# Patient Record
Sex: Male | Born: 1985 | Race: Black or African American | Hispanic: No | Marital: Single | Smoking: Current every day smoker
Health system: Southern US, Community
[De-identification: ages and names within clinical notes are randomized; demographics above are authoritative.]

## PROBLEM LIST (undated history)

## (undated) ENCOUNTER — Emergency Department (HOSPITAL_COMMUNITY): Admission: EM | Disposition: A | Discharge status: Home / Self Care | Payer: Self-pay

## (undated) DIAGNOSIS — Z9109 Other allergy status, other than to drugs and biological substances: Secondary | ICD-10-CM

## (undated) DIAGNOSIS — S86019A Strain of unspecified Achilles tendon, initial encounter: Secondary | ICD-10-CM

---
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
## 2008-08-13 ENCOUNTER — Emergency Department (HOSPITAL_COMMUNITY): Admission: EM | Admit: 2008-08-13 | Discharge: 2008-08-13 | Payer: Self-pay | Admitting: Emergency Medicine

## 2009-04-30 ENCOUNTER — Emergency Department (HOSPITAL_COMMUNITY): Admission: EM | Admit: 2009-04-30 | Discharge: 2009-04-30 | Payer: Self-pay | Admitting: Emergency Medicine

## 2009-05-08 ENCOUNTER — Emergency Department (HOSPITAL_COMMUNITY): Admission: EM | Admit: 2009-05-08 | Discharge: 2009-05-08 | Payer: Self-pay | Admitting: Emergency Medicine

## 2009-07-28 ENCOUNTER — Emergency Department (HOSPITAL_COMMUNITY): Admission: EM | Admit: 2009-07-28 | Discharge: 2009-07-28 | Payer: Self-pay | Admitting: Emergency Medicine

## 2009-08-07 ENCOUNTER — Emergency Department (HOSPITAL_COMMUNITY): Admission: EM | Admit: 2009-08-07 | Discharge: 2009-08-07 | Payer: Self-pay | Admitting: Emergency Medicine

## 2010-03-25 LAB — BASIC METABOLIC PANEL
BUN: 13 mg/dL (ref 6–23)
CO2: 25 mEq/L (ref 19–32)
Calcium: 8.8 mg/dL (ref 8.4–10.5)
Chloride: 103 mEq/L (ref 96–112)
Creatinine, Ser: 1.26 mg/dL (ref 0.4–1.5)
GFR calc Af Amer: >60 mL/min (ref >60)
GFR calc non Af Amer: >60 mL/min (ref >60)
Glucose, Bld: 88 mg/dL (ref 70–99)
Potassium: 3 mEq/L — ABNORMAL LOW (ref 3.5–5.1)
Sodium: 139 mEq/L (ref 135–145)

## 2010-03-25 LAB — RAPID URINE DRUG SCREEN, HOSP PERFORMED
Amphetamines: NOT DETECTED
Barbiturates: NOT DETECTED
Benzodiazepines: NOT DETECTED
Opiates: NOT DETECTED

## 2010-03-25 LAB — CBC
MCHC: 33.1 g/dL (ref 30.0–36.0)
RBC: 4.58 MIL/uL (ref 4.22–5.81)
RDW: 13.7 % (ref 11.5–15.5)

## 2010-03-25 LAB — URINALYSIS, ROUTINE W REFLEX MICROSCOPIC
Glucose, UA: NEGATIVE mg/dL
Hgb urine dipstick: NEGATIVE
Specific Gravity, Urine: 1.041 — ABNORMAL HIGH (ref 1.005–1.030)
pH: 6 (ref 5.0–8.0)

## 2010-03-25 LAB — DIFFERENTIAL
Basophils Absolute: 0 10*3/uL (ref 0.0–0.1)
Basophils Relative: 0 % (ref 0–1)
Monocytes Relative: 10 % (ref 3–12)
Neutro Abs: 5.1 10*3/uL (ref 1.7–7.7)
Neutrophils Relative %: 63 % (ref 43–77)

## 2010-03-25 LAB — PROTIME-INR
INR: 0.9 (ref 0.00–1.49)
Prothrombin Time: 12.1 seconds (ref 11.6–15.2)

## 2010-03-25 LAB — APTT: aPTT: 25 seconds (ref 24–37)

## 2010-03-25 LAB — ETHANOL: Alcohol, Ethyl (B): 126 mg/dL — ABNORMAL HIGH (ref 0–10)

## 2010-04-13 LAB — URINALYSIS, ROUTINE W REFLEX MICROSCOPIC
Bilirubin Urine: NEGATIVE
Glucose, UA: NEGATIVE mg/dL
Hgb urine dipstick: NEGATIVE
Protein, ur: NEGATIVE mg/dL
Urobilinogen, UA: 1 mg/dL (ref 0.0–1.0)

## 2010-04-13 LAB — GC/CHLAMYDIA PROBE AMP, GENITAL
Chlamydia, DNA Probe: NEGATIVE
GC Probe Amp, Genital: NEGATIVE

## 2010-04-13 LAB — URINE MICROSCOPIC-ADD ON

## 2011-02-06 ENCOUNTER — Encounter (HOSPITAL_COMMUNITY): Payer: Self-pay

## 2011-02-06 ENCOUNTER — Emergency Department (INDEPENDENT_AMBULATORY_CARE_PROVIDER_SITE_OTHER)
Admission: EM | Admit: 2011-02-06 | Discharge: 2011-02-06 | Disposition: A | Discharge status: Home / Self Care | Payer: Self-pay | Source: Home / Self Care | Attending: Emergency Medicine | Admitting: Emergency Medicine

## 2011-02-06 DIAGNOSIS — B350 Tinea barbae and tinea capitis: Secondary | ICD-10-CM

## 2011-02-06 DIAGNOSIS — L918 Other hypertrophic disorders of the skin: Secondary | ICD-10-CM

## 2011-02-06 DIAGNOSIS — L909 Atrophic disorder of skin, unspecified: Secondary | ICD-10-CM

## 2011-02-06 MED ORDER — KETOCONAZOLE 2 % EX CREA: TOPICAL_CREAM | Freq: Two times a day (BID) | CUTANEOUS | Status: DC

## 2011-02-06 MED ORDER — KETOCONAZOLE 2 % EX SHAM: MEDICATED_SHAMPOO | CUTANEOUS | Status: AC

## 2011-02-06 NOTE — ED Provider Notes (Addendum)
History     CSN: [id number]  Arrival date & time 02/06/11  1239   First MD Initiated Contact with Patient 02/06/11 1312      Chief Complaint  Patient presents with  . Tinea  . Rash    (Consider location/radiation/quality/duration/timing/severity/associated sxs/prior treatment) Patient is a 26 y.o. male presenting with rash. The history is provided by the patient.  Rash  This is a new problem. Episode onset:  more than 2 weeks scalp ringworm and months with  skin tags almost 1-2 yerars. The problem is associated with nothing. There has been no fever. The patient is experiencing no pain. He has tried nothing for the symptoms. The treatment provided no relief.    History reviewed. No pertinent past medical history.  History reviewed. No pertinent past surgical history.  No family history on file.  History  Substance Use Topics  . Smoking status: Never Smoker   . Smokeless tobacco: Not on file  . Alcohol Use: No      Review of Systems  Constitutional: Negative for fever and fatigue.  Skin: Positive for rash.    Allergies  Review of patient's allergies indicates no known allergies.  Home Medications   Current Outpatient Rx  Name Route Sig Dispense Refill  . KETOCONAZOLE 2 % EX CREA Topical Apply topically 2 (two) times daily. X 3 weeks 75 g 0  . KETOCONAZOLE 2 % EX SHAM Topical Apply topically 2 (two) times a week. X 2 weeks 120 mL 0    BP 128/73  Pulse 65  Temp(Src) 98.7 F (37.1 C) (Oral)  Resp 20  SpO2 98%  Physical Exam  Nursing note and vitals reviewed. Constitutional: He appears well-developed and well-nourished. No distress.  HENT:  Head: Normocephalic.    Eyes: Conjunctivae are normal.  Neck: Neck supple.  Genitourinary:     Skin: Rash noted. No erythema.    ED Course  Procedures (including critical care time)  Labs Reviewed - No data to display No results found.   1. Tinea capitis   2. Cutaneous skin tags       MDM  Tinea  capitus and suprapubic condylomas acuminata- referred to dermatologist. Does not want to take tablets concerned about liver cause drinking other supplements. Wants to try with local shampoos despite been advised probably will not be efficient        Jimmie Molly, MD 02/06/11 1402  Jimmie Molly, MD 02/06/11 [phone]

## 2011-02-06 NOTE — ED Notes (Signed)
C/o ringworm to post. Scalp that he noticed approx 1 week ago.  Also states he has some "moles" in his groin area that are getting larger over the last year that he would like checked.

## 2011-06-11 ENCOUNTER — Encounter (HOSPITAL_COMMUNITY): Payer: Self-pay

## 2011-06-11 ENCOUNTER — Emergency Department (INDEPENDENT_AMBULATORY_CARE_PROVIDER_SITE_OTHER)
Admission: EM | Admit: 2011-06-11 | Discharge: 2011-06-11 | Disposition: A | Discharge status: Home / Self Care | Payer: Self-pay | Source: Home / Self Care | Attending: Family Medicine | Admitting: Family Medicine

## 2011-06-11 DIAGNOSIS — B029 Zoster without complications: Secondary | ICD-10-CM

## 2011-06-11 MED ORDER — ACYCLOVIR 800 MG PO TABS: ORAL_TABLET | ORAL | Status: DC

## 2011-06-11 MED ORDER — ACYCLOVIR 800 MG PO TABS: 800.0000 mg | ORAL_TABLET | Freq: Every day | ORAL | Status: DC

## 2011-06-11 NOTE — Discharge Instructions (Signed)
Avoid pregnant females. Follow up as needed. May take ibuprofen as needed for discomfort. Shingles Shingles is caused by the same virus that causes chickenpox (varicella zoster virus or VZV). Shingles often occurs many years or decades after having chickenpox. That is why it is more common in adults older than 50 years. The virus reactivates and breaks out as an infection in a nerve root. SYMPTOMS   The initial feeling (sensations) may be pain. This pain is usually described as:   Burning.   Stabbing.   Throbbing.   Tingling in the nerve root.   A red rash will follow in a couple days. The rash may occur in any area of the body and is usually on one side (unilateral) of the body in a band or belt-like pattern. The rash usually starts out as very small blisters (vesicles). They will dry up after 7 to 10 days. This is not usually a significant problem except for the pain it causes.   Long-lasting (chronic) pain is more likely in an elderly person. It can last months to years. This condition is called postherpetic neuralgia.  Shingles can be an extremely severe infection in someone with AIDS, a weakened immune system, or with forms of leukemia. It can also be severe if you are taking transplant medicines or other medicines that weaken the immune system. TREATMENT  Your caregiver will often treat you with:  Antiviral drugs.   Anti-inflammatory drugs.   Pain medicines.  Bed rest is very important in preventing the pain associated with herpes zoster (postherpetic neuralgia). Application of heat in the form of a hot water bottle or electric heating pad or gentle pressure with the hand is recommended to help with the pain or discomfort. PREVENTION  A varicella zoster vaccine is available to help protect against the virus. The Food and Drug Administration approved the varicella zoster vaccine for individuals 48 years of age and older. HOME CARE INSTRUCTIONS   Cool compresses to the area of rash  may be helpful.   Only take over-the-counter or prescription medicines for pain, discomfort, or fever as directed by your caregiver.   Avoid contact with:   Babies.   Pregnant women.   Children with eczema.   Elderly people with transplants.   People with chronic illnesses, such as leukemia and AIDS.   If the area involved is on your face, you may receive a referral for follow-up to a specialist. It is very important to keep all follow-up appointments. This will help avoid eye complications, chronic pain, or disability.  SEEK IMMEDIATE MEDICAL CARE IF:   You develop any pain (headache) in the area of the face or eye. This must be followed carefully by your caregiver or ophthalmologist. An infection in part of your eye (cornea) can be very serious. It could lead to blindness.   You do not have pain relief from prescribed medicines.   Your redness or swelling spreads.   The area involved becomes very swollen and painful.   You have a fever.   You notice any red or painful lines extending away from the affected area toward your heart (lymphangitis).   Your condition is worsening or has changed.  Document Released: 12/22/2004 Document Revised: 12/11/2010 Document Reviewed: 11/26/2008 [facility] Patient Information 2012 [place], [place].

## 2011-06-11 NOTE — ED Provider Notes (Signed)
History     CSN: [id number]  Arrival date & time 06/11/11  1109   First MD Initiated Contact with Patient 06/11/11 1131      Chief Complaint  Patient presents with  . Skin Ulcer    (Consider location/radiation/quality/duration/timing/severity/associated sxs/prior treatment) HPI Comments: The patient noted a tingling left upper chest 2 days prior to the onset of a blistering erruption. Lesions now painful and he has a swollen lymph node in his axilla. No treatment pta.   The history is provided by the patient.    History reviewed. No pertinent past medical history.  History reviewed. No pertinent past surgical history.  History reviewed. No pertinent family history.  History  Substance Use Topics  . Smoking status: Never Smoker   . Smokeless tobacco: Not on file  . Alcohol Use: No      Review of Systems  Allergies  Review of patient's allergies indicates no known allergies.  Home Medications   Current Outpatient Rx  Name Route Sig Dispense Refill  . KETOCONAZOLE 2 % EX CREA Topical Apply topically 2 (two) times daily. X 3 weeks 75 g 0    BP 139/76  Pulse 61  Temp(Src) 98.4 F (36.9 C) (Oral)  Resp 20  SpO2 100%  Physical Exam  Nursing note and vitals reviewed. Constitutional: He appears well-developed and well-nourished. No distress.  HENT:  Head: Normocephalic and atraumatic.  Cardiovascular: Normal rate and regular rhythm.   Pulmonary/Chest: Effort normal and breath sounds normal.  Skin: Skin is warm.       Blistering eruption note left upper ant chest. Appearance of shingles.     ED Course  Procedures (including critical care time)  Labs Reviewed - No data to display No results found.   1. Shingles       MDM          Randa Spike, MD 06/11/11 1324

## 2011-06-11 NOTE — ED Notes (Signed)
Noted lesion left chest 2-3 days ago, and has a lump in left axilla since then; NAD

## 2011-06-13 ENCOUNTER — Emergency Department (HOSPITAL_COMMUNITY)
Admission: EM | Admit: 2011-06-13 | Discharge: 2011-06-13 | Disposition: A | Discharge status: Home / Self Care | Payer: Self-pay | Attending: Emergency Medicine | Admitting: Emergency Medicine

## 2011-06-13 ENCOUNTER — Encounter (HOSPITAL_COMMUNITY): Payer: Self-pay

## 2011-06-13 DIAGNOSIS — B029 Zoster without complications: Secondary | ICD-10-CM | POA: Insufficient documentation

## 2011-06-13 MED ORDER — OXYCODONE-ACETAMINOPHEN 5-325 MG PO TABS: 1.0000 | ORAL_TABLET | Freq: Four times a day (QID) | ORAL | Status: AC | PRN

## 2011-06-13 MED ORDER — PREDNISONE 20 MG PO TABS: 40.0000 mg | ORAL_TABLET | Freq: Every day | ORAL | Status: DC

## 2011-06-13 NOTE — ED Provider Notes (Signed)
History     CSN: [id number]  Arrival date & time 06/13/11  1209   First MD Initiated Contact with Patient 06/13/11 1242      Chief Complaint  Patient presents with  . Rash    (Consider location/radiation/quality/duration/timing/severity/associated sxs/prior treatment) Patient is a 26 y.o. male presenting with rash. The history is provided by the patient.  Rash  This is a new problem. The current episode started more than 2 days ago. The problem has not changed since onset.The problem is associated with nothing. There has been no fever. The rash is present on the torso. The pain is moderate. The pain has been fluctuating since onset. Associated symptoms include blisters, itching and pain. Treatments tried: acyclovir. Improvement on treatment: yes, with taking his mother's oxycodone.  Seen at [facility] 2 days ago with antiviral initiation. Continued rash. No fever, chills. No purulent drainage.  Past Medical History  Diagnosis Date  . Shingles outbreak     History reviewed. No pertinent past surgical history.  History reviewed. No pertinent family history.  History  Substance Use Topics  . Smoking status: Never Smoker   . Smokeless tobacco: Not on file  . Alcohol Use: No      Review of Systems  Constitutional: Negative for fever and chills.  Musculoskeletal: Negative for back pain.  Skin: Positive for itching and rash.  Neurological: Negative for headaches.    Allergies  Review of patient's allergies indicates no known allergies.  Home Medications   Current Outpatient Rx  Name Route Sig Dispense Refill  . ACYCLOVIR 800 MG PO TABS Oral Take 800 mg by mouth See admin instructions. Take 1 tab five times daily      BP 134/73  Pulse 86  Temp(Src) 98.7 F (37.1 C) (Oral)  Resp 22  Ht 5\' 9"  (1.753 m)  Wt 205 lb (92.987 kg)  BMI 30.27 kg/m2  SpO2 99%  Physical Exam  Nursing note and vitals reviewed. Constitutional: He appears well-developed and well-nourished. No  distress.  HENT:  Head: Normocephalic and atraumatic.  Neck: Neck supple.  Cardiovascular: Normal rate.   Pulmonary/Chest: Effort normal.  Neurological: He is alert.  Skin: Skin is warm and dry.       Vesicular rash with both blisters and crusted lesions to left lateral T3 dermatome. No active drainage seen    ED Course  Procedures (including critical care time)  Labs Reviewed - No data to display No results found.   Dx 1: Shingles   MDM  Shingles rash. Will add prednisone and pain rx per discussion with pt. No signs of infection to blistered area.         Shaaron Adler, PA-C 06/13/11 1329

## 2011-06-13 NOTE — ED Notes (Signed)
[id number]<AV> Expected date:<BR> Expected time:<BR> Means of arrival:<BR> Comments:<BR> Hold for Triage 2

## 2011-06-13 NOTE — ED Notes (Signed)
Airborne precautions and contact precautions posted on door. Pt aware.

## 2011-06-13 NOTE — Discharge Instructions (Signed)
Shingles Shingles is caused by the same virus that causes chickenpox (varicella zoster virus or VZV). Shingles often occurs many years or decades after having chickenpox. That is why it is more common in adults older than 50 years. The virus reactivates and breaks out as an infection in a nerve root. SYMPTOMS   The initial feeling (sensations) may be pain. This pain is usually described as:   Burning.   Stabbing.   Throbbing.   Tingling in the nerve root.   A red rash will follow in a couple days. The rash may occur in any area of the body and is usually on one side (unilateral) of the body in a band or belt-like pattern. The rash usually starts out as very small blisters (vesicles). They will dry up after 7 to 10 days. This is not usually a significant problem except for the pain it causes.   Long-lasting (chronic) pain is more likely in an elderly person. It can last months to years. This condition is called postherpetic neuralgia.  Shingles can be an extremely severe infection in someone with AIDS, a weakened immune system, or with forms of leukemia. It can also be severe if you are taking transplant medicines or other medicines that weaken the immune system. TREATMENT  Your caregiver will often treat you with:  Antiviral drugs.   Anti-inflammatory drugs.   Pain medicines.  Bed rest is very important in preventing the pain associated with herpes zoster (postherpetic neuralgia). Application of heat in the form of a hot water bottle or electric heating pad or gentle pressure with the hand is recommended to help with the pain or discomfort. PREVENTION  A varicella zoster vaccine is available to help protect against the virus. The Food and Drug Administration approved the varicella zoster vaccine for individuals 50 years of age and older. HOME CARE INSTRUCTIONS   Cool compresses to the area of rash may be helpful.   Only take over-the-counter or prescription medicines for pain,  discomfort, or fever as directed by your caregiver.   Avoid contact with:   Babies.   Pregnant women.   Children with eczema.   Elderly people with transplants.   People with chronic illnesses, such as leukemia and AIDS.   If the area involved is on your face, you may receive a referral for follow-up to a specialist. It is very important to keep all follow-up appointments. This will help avoid eye complications, chronic pain, or disability.  SEEK IMMEDIATE MEDICAL CARE IF:   You develop any pain (headache) in the area of the face or eye. This must be followed carefully by your caregiver or ophthalmologist. An infection in part of your eye (cornea) can be very serious. It could lead to blindness.   You do not have pain relief from prescribed medicines.   Your redness or swelling spreads.   The area involved becomes very swollen and painful.   You have a fever.   You notice any red or painful lines extending away from the affected area toward your heart (lymphangitis).   Your condition is worsening or has changed.  Document Released: 12/22/2004 Document Revised: 12/11/2010 Document Reviewed: 11/26/2008 [facility] Patient Information 2012 [place], [place].     If you have no primary doctor, here are some resources that may be helpful:  Medicaid-accepting [facility] Providers:   - [facility]- [address]      [phone]      Mon-Fri 9am-7pm, Sat 9am-1pm   -  [facility]- [address] [place], [address] [place]      [phone]   - [facility]- [address], [address] [place]      [phone]   [facility]- [address]      [phone]   - Renaye Rakers- [address] [place], [address]      [phone]      Only accepts [place] patients       after they have her name applied to their card   Self Pay (no insurance) in [place]:   - Sickle Cell Patients: Dr Willey Blade, [facility]  Internal Medicine      [address] [place]      [phone]   - Health [phone]   - Physician Referral Service- [phone]   - [facility]- [address] [place]      [phone]   [facility]- [address]   - [facility]- see information above      (Speak to [employer] if you do not have insurance)   - [facility]- [address] [place]      [phone]   - [facility] [place]- [address]      [phone]   - [facility]- [address]      [phone]   - Dr Julio Sicks-  [address], [place]      [phone]   - [facility]- [address]      [phone]   - [facility]- [address]      [phone]      Also [address]      [phone]   - [facility]- [address]      [phone]      1st and 3rd Saturday every month, 10am-1pm Other agencies that provide inexpensive medical care:    [facility]  [phone]    [facility] Internal Medicine  [phone]    [facility]  [phone]    [facility]  [phone]    [facility]  [phone]  General Information: Finding a doctor when you do not have health insurance can be tricky. Although you are not limited by an insurance plan, you are of course limited by her finances and how much but he can pay out of pocket.  What are your options if you don't have health insurance?   1) Find a Librarian, academic and Pay Out of Pocket Although you won't have to find out who is covered by your insurance plan, it is a good idea to ask around and get recommendations. You will then need to call the office and see if the doctor you have chosen will accept you as a new patient and what types of options they offer for patients who are self-pay. Some doctors offer discounts or will set up payment plans for their patients who do not have insurance, but you will need to ask so you aren't  surprised when you get to your appointment.  2) Contact Your Local Health Department Not all health departments have doctors that can see patients for sick visits, but many do, so it is worth a call to see if yours does. If you don't know where your local health department is, you can check in your phone  book. The CDC also has a tool to help you locate your state's health department, and many state websites also have listings of all of their local health departments.  3) Find a Walk-in Clinic If your illness is not likely to be very severe or complicated, you may want to try a walk in clinic. These are popping up all over the country in pharmacies, drugstores, and shopping centers. They're usually staffed by nurse practitioners or physician assistants that have been trained to treat common illnesses and complaints. They're usually fairly quick and inexpensive. However, if you have serious medical issues or chronic medical problems, these are probably not your best option
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
## 2011-06-13 NOTE — ED Provider Notes (Signed)
Medical screening examination/treatment/procedure(s) were performed by non-physician practitioner and as supervising physician I was immediately available for consultation/collaboration.  Gerhard Munch, MD 06/13/11 1547

## 2011-07-31 ENCOUNTER — Encounter (HOSPITAL_COMMUNITY): Payer: Self-pay | Admitting: *Deleted

## 2011-07-31 ENCOUNTER — Emergency Department (HOSPITAL_COMMUNITY)
Admission: EM | Admit: 2011-07-31 | Discharge: 2011-07-31 | Discharge status: Against Medical Advice | Payer: Self-pay | Attending: Emergency Medicine | Admitting: Emergency Medicine

## 2011-07-31 DIAGNOSIS — R52 Pain, unspecified: Secondary | ICD-10-CM | POA: Insufficient documentation

## 2011-07-31 NOTE — Progress Notes (Signed)
CM spoke with pt who confirms self pay [facility] resident with no have a pcp county. Discussed the importance of a pcp for f/u. Reviewed Health connect number to assist with finding self pay provider close to pt's residence. Reviewed resources for [facility], general medial clinics, medications-needymeds.com, housing, DSS, health Department and other resources in [employer] including crisis programs Pt voiced understanding and appreciation of resources provided seen by [facility] and offered assist with finding pcp
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
## 2013-01-13 ENCOUNTER — Encounter (HOSPITAL_COMMUNITY): Payer: Self-pay | Admitting: Emergency Medicine

## 2013-01-13 ENCOUNTER — Emergency Department (INDEPENDENT_AMBULATORY_CARE_PROVIDER_SITE_OTHER)
Admission: EM | Admit: 2013-01-13 | Discharge: 2013-01-13 | Disposition: A | Discharge status: Home / Self Care | Payer: Self-pay | Source: Home / Self Care | Attending: Family Medicine | Admitting: Family Medicine

## 2013-01-13 DIAGNOSIS — R109 Unspecified abdominal pain: Secondary | ICD-10-CM

## 2013-01-13 NOTE — Discharge Instructions (Signed)
Thank you for coming in today. Bring back a stool sample for testing.  I am here all day tomorrow.  If your belly pain worsens, or you have high fever, bad vomiting, blood in your stool or black tarry stool go to the Emergency Room.

## 2013-01-13 NOTE — ED Notes (Signed)
C/o movement in abdomen.  States looked online for information and seems to think that he has a tape worm.  Pt has tried to flush system with salt water and vinegar.  Normal BM.  On set 1/8

## 2013-01-13 NOTE — ED Provider Notes (Addendum)
Steve Best is a 28 y.o. male who presents to Urgent Care today for stomach worm.  Patient feels as though there is a parasite wiggling in his stomach. This is been present for the past 2 days. Specifically he feels a wiggling sensation in his stomach. He denies any nausea vomiting or diarrhea. He took a saltwater laxative which seemed to not do much. He does note that he drinks vinegar the wiggling sensation goes away. He denies any foreign travel or possible exposure to anyone with intestinal parasites. He has never had a like this happen before. He has not tried any medications yet.  Past Medical History  Diagnosis Date  . Shingles outbreak    History  Substance Use Topics  . Smoking status: Current Every Day Smoker -- 0.50 packs/day  . Smokeless tobacco: Not on file  . Alcohol Use: Yes     Comment: 2-3 wk   ROS as above Medications reviewed. No current facility-administered medications for this encounter.   No current outpatient prescriptions on file.    Exam:  Temperature 97.8 heart rate 69 pressure 137/78 respiratory rate 16 oxygen saturation 100%  Gen: Well NAD HEENT: EOMI,  MMM Lungs: Normal work of breathing. CTABL Heart: RRR no MRG Abd: NABS, Soft. NT, ND Exts: Non edematous BL  LE, warm and well perfused.   Assessment and Plan: 28 y.o. male with  abdominal sensation. I am very doubtful for intestinal parasite and feel that his symptoms are most likely abdominal cramping or gas. However he is pretty convinced. We'll obtain a stool ova and parasite test. He is unable to defecate currently so we'll send him home with the test and have him bring it back.  I will call with results.  Discussed warning signs or symptoms. Please see discharge instructions. Patient expresses understanding.    Steve BongEvan S Diego Ulbricht, MD 01/13/13 2119  Steve BongEvan S Jaret Coppedge, MD 01/13/13 2119

## 2013-04-05 DIAGNOSIS — S86019A Strain of unspecified Achilles tendon, initial encounter: Secondary | ICD-10-CM

## 2013-04-05 HISTORY — DX: Strain of unspecified achilles tendon, initial encounter: S86.019A

## 2013-04-14 ENCOUNTER — Other Ambulatory Visit: Payer: Self-pay | Admitting: Orthopedic Surgery

## 2013-04-14 ENCOUNTER — Encounter (HOSPITAL_BASED_OUTPATIENT_CLINIC_OR_DEPARTMENT_OTHER): Payer: Self-pay | Admitting: *Deleted

## 2013-04-17 ENCOUNTER — Ambulatory Visit (HOSPITAL_BASED_OUTPATIENT_CLINIC_OR_DEPARTMENT_OTHER)
Admission: RE | Admit: 2013-04-17 | Discharge: 2013-04-17 | Disposition: A | Discharge status: Home / Self Care | Payer: Self-pay | Source: Ambulatory Visit | Attending: Orthopedic Surgery | Admitting: Orthopedic Surgery

## 2013-04-17 ENCOUNTER — Encounter (HOSPITAL_BASED_OUTPATIENT_CLINIC_OR_DEPARTMENT_OTHER)
Admission: RE | Disposition: A | Discharge status: Home / Self Care | Payer: Self-pay | Source: Ambulatory Visit | Attending: Orthopedic Surgery

## 2013-04-17 ENCOUNTER — Ambulatory Visit (HOSPITAL_BASED_OUTPATIENT_CLINIC_OR_DEPARTMENT_OTHER): Payer: Self-pay | Admitting: Anesthesiology

## 2013-04-17 ENCOUNTER — Encounter (HOSPITAL_BASED_OUTPATIENT_CLINIC_OR_DEPARTMENT_OTHER): Payer: Self-pay | Admitting: Anesthesiology

## 2013-04-17 DIAGNOSIS — S93499A Sprain of other ligament of unspecified ankle, initial encounter: Secondary | ICD-10-CM | POA: Insufficient documentation

## 2013-04-17 DIAGNOSIS — S96819A Strain of other specified muscles and tendons at ankle and foot level, unspecified foot, initial encounter: Principal | ICD-10-CM

## 2013-04-17 DIAGNOSIS — J309 Allergic rhinitis, unspecified: Secondary | ICD-10-CM | POA: Insufficient documentation

## 2013-04-17 DIAGNOSIS — X58XXXA Exposure to other specified factors, initial encounter: Secondary | ICD-10-CM | POA: Insufficient documentation

## 2013-04-17 DIAGNOSIS — F17200 Nicotine dependence, unspecified, uncomplicated: Secondary | ICD-10-CM | POA: Insufficient documentation

## 2013-04-17 HISTORY — PX: ACHILLES TENDON SURGERY: SHX542

## 2013-04-17 HISTORY — DX: Other allergy status, other than to drugs and biological substances: Z91.09

## 2013-04-17 HISTORY — DX: Strain of unspecified achilles tendon, initial encounter: S86.019A

## 2013-04-17 LAB — POCT HEMOGLOBIN-HEMACUE: HEMOGLOBIN: 14.1 g/dL (ref 13.0–17.0)

## 2013-04-17 SURGERY — REPAIR, TENDON, ACHILLES
Anesthesia: Regional | Laterality: Left

## 2013-04-17 MED ORDER — SUCCINYLCHOLINE CHLORIDE 20 MG/ML IJ SOLN
INTRAMUSCULAR | Status: DC | PRN
  Administered 2013-04-17: 100 mg via INTRAVENOUS

## 2013-04-17 MED ORDER — BUPIVACAINE HCL (PF) 0.5 % IJ SOLN
INTRAMUSCULAR | Status: AC
  Filled 2013-04-17: qty 30

## 2013-04-17 MED ORDER — DEXAMETHASONE SODIUM PHOSPHATE 4 MG/ML IJ SOLN
INTRAMUSCULAR | Status: DC | PRN
  Administered 2013-04-17: 10 mg via INTRAVENOUS

## 2013-04-17 MED ORDER — BUPIVACAINE-EPINEPHRINE PF 0.5-1:200000 % IJ SOLN
INTRAMUSCULAR | Status: DC | PRN
  Administered 2013-04-17: 30 mL via PERINEURAL

## 2013-04-17 MED ORDER — CEFAZOLIN SODIUM-DEXTROSE 2-3 GM-% IV SOLR
2.0000 g | INTRAVENOUS | Status: AC
  Administered 2013-04-17: 2 g via INTRAVENOUS

## 2013-04-17 MED ORDER — FENTANYL CITRATE 0.05 MG/ML IJ SOLN
50.0000 ug | INTRAMUSCULAR | Status: DC | PRN
  Administered 2013-04-17: 100 ug via INTRAVENOUS

## 2013-04-17 MED ORDER — LIDOCAINE HCL (CARDIAC) 20 MG/ML IV SOLN
INTRAVENOUS | Status: DC | PRN
  Administered 2013-04-17: 100 mg via INTRAVENOUS

## 2013-04-17 MED ORDER — MIDAZOLAM HCL 2 MG/2ML IJ SOLN
INTRAMUSCULAR | Status: AC
  Filled 2013-04-17: qty 2

## 2013-04-17 MED ORDER — POVIDONE-IODINE 7.5 % EX SOLN: Freq: Once | CUTANEOUS | Status: DC

## 2013-04-17 MED ORDER — MIDAZOLAM HCL 2 MG/ML PO SYRP: 12.0000 mg | ORAL_SOLUTION | Freq: Once | ORAL | Status: DC | PRN

## 2013-04-17 MED ORDER — LACTATED RINGERS IV SOLN
INTRAVENOUS | Status: DC
  Administered 2013-04-17: 13:00:00 via INTRAVENOUS

## 2013-04-17 MED ORDER — MIDAZOLAM HCL 2 MG/2ML IJ SOLN
1.0000 mg | INTRAMUSCULAR | Status: DC | PRN
  Administered 2013-04-17: 2 mg via INTRAVENOUS

## 2013-04-17 MED ORDER — FENTANYL CITRATE 0.05 MG/ML IJ SOLN
INTRAMUSCULAR | Status: AC
  Filled 2013-04-17: qty 4

## 2013-04-17 MED ORDER — FENTANYL CITRATE 0.05 MG/ML IJ SOLN
INTRAMUSCULAR | Status: AC
  Filled 2013-04-17: qty 2

## 2013-04-17 MED ORDER — CEFAZOLIN SODIUM-DEXTROSE 2-3 GM-% IV SOLR
INTRAVENOUS | Status: AC
  Filled 2013-04-17: qty 50

## 2013-04-17 MED ORDER — BUPIVACAINE HCL (PF) 0.5 % IJ SOLN
INTRAMUSCULAR | Status: DC | PRN
  Administered 2013-04-17: 10 mL via PERINEURAL

## 2013-04-17 MED ORDER — PROPOFOL 10 MG/ML IV BOLUS
INTRAVENOUS | Status: DC | PRN
  Administered 2013-04-17: 250 mg via INTRAVENOUS

## 2013-04-17 SURGICAL SUPPLY — 71 items
BANDAGE ELASTIC 4 VELCRO ST LF (GAUZE/BANDAGES/DRESSINGS) ×3 IMPLANT
BANDAGE ELASTIC 6 VELCRO ST LF (GAUZE/BANDAGES/DRESSINGS) ×3 IMPLANT
BANDAGE ESMARK 6X9 LF (GAUZE/BANDAGES/DRESSINGS) IMPLANT
BLADE SURG 10 STRL SS (BLADE) ×3 IMPLANT
BLADE SURG 15 STRL LF DISP TIS (BLADE) ×1 IMPLANT
BLADE SURG 15 STRL SS (BLADE) ×3
BNDG CMPR 9X4 STRL LF SNTH (GAUZE/BANDAGES/DRESSINGS)
BNDG CMPR 9X6 STRL LF SNTH (GAUZE/BANDAGES/DRESSINGS) ×1
BNDG ESMARK 4X9 LF (GAUZE/BANDAGES/DRESSINGS) IMPLANT
BNDG ESMARK 6X9 LF (GAUZE/BANDAGES/DRESSINGS) ×3
COVER TABLE BACK 60X90 (DRAPES) ×3 IMPLANT
CUFF TOURNIQUET SINGLE 34IN LL (TOURNIQUET CUFF) ×3 IMPLANT
DECANTER SPIKE VIAL GLASS SM (MISCELLANEOUS) IMPLANT
DRAPE EXTREMITY T 121X128X90 (DRAPE) ×3 IMPLANT
DRAPE U 20/CS (DRAPES) ×3 IMPLANT
DRAPE U-SHAPE 47X51 STRL (DRAPES) ×3 IMPLANT
DURAPREP 26ML APPLICATOR (WOUND CARE) ×3 IMPLANT
ELECT REM PT RETURN 9FT ADLT (ELECTROSURGICAL) ×3
ELECTRODE REM PT RTRN 9FT ADLT (ELECTROSURGICAL) ×1 IMPLANT
GAUZE XEROFORM 1X8 LF (GAUZE/BANDAGES/DRESSINGS) ×3 IMPLANT
GLOVE BIO SURGEON STRL SZ 6.5 (GLOVE) ×1 IMPLANT
GLOVE BIO SURGEONS STRL SZ 6.5 (GLOVE) ×1
GLOVE BIOGEL M STRL SZ7.5 (GLOVE) ×2 IMPLANT
GLOVE BIOGEL PI IND STRL 7.0 (GLOVE) IMPLANT
GLOVE BIOGEL PI IND STRL 8 (GLOVE) ×2 IMPLANT
GLOVE BIOGEL PI INDICATOR 7.0 (GLOVE) ×4
GLOVE BIOGEL PI INDICATOR 8 (GLOVE) ×4
GLOVE ECLIPSE 6.5 STRL STRAW (GLOVE) ×4 IMPLANT
GLOVE ECLIPSE 7.5 STRL STRAW (GLOVE) ×6 IMPLANT
GOWN STRL REUS W/ TWL LRG LVL3 (GOWN DISPOSABLE) ×1 IMPLANT
GOWN STRL REUS W/ TWL XL LVL3 (GOWN DISPOSABLE) ×1 IMPLANT
GOWN STRL REUS W/TWL LRG LVL3 (GOWN DISPOSABLE) ×9
GOWN STRL REUS W/TWL XL LVL3 (GOWN DISPOSABLE) ×6 IMPLANT
NDL HYPO 25X1 1.5 SAFETY (NEEDLE) IMPLANT
NDL SUT 6 .5 CRC .975X.05 MAYO (NEEDLE) IMPLANT
NEEDLE HYPO 25X1 1.5 SAFETY (NEEDLE) IMPLANT
NEEDLE MAYO TAPER (NEEDLE)
NS IRRIG 1000ML POUR BTL (IV SOLUTION) ×3 IMPLANT
PAD CAST 4YDX4 CTTN HI CHSV (CAST SUPPLIES) ×2 IMPLANT
PADDING CAST ABS 4INX4YD NS (CAST SUPPLIES)
PADDING CAST ABS COTTON 4X4 ST (CAST SUPPLIES) ×1 IMPLANT
PADDING CAST COTTON 4X4 STRL (CAST SUPPLIES) ×6
PASSER SUT SWANSON 36MM LOOP (INSTRUMENTS) IMPLANT
PENCIL BUTTON HOLSTER BLD 10FT (ELECTRODE) ×3 IMPLANT
SPLINT FAST PLASTER 5X30 (CAST SUPPLIES)
SPLINT FIBERGLASS 4X30 (CAST SUPPLIES) ×2 IMPLANT
SPLINT PLASTER CAST FAST 5X30 (CAST SUPPLIES) ×2 IMPLANT
SPONGE GAUZE 4X4 12PLY (GAUZE/BANDAGES/DRESSINGS) ×3 IMPLANT
SPONGE LAP 4X18 X RAY DECT (DISPOSABLE) ×3 IMPLANT
STOCKINETTE 6  STRL (DRAPES) ×2
STOCKINETTE 6 STRL (DRAPES) ×1 IMPLANT
SUCTION FRAZIER TIP 10 FR DISP (SUCTIONS) IMPLANT
SUT ETHIBOND 0 MO6 C/R (SUTURE) IMPLANT
SUT ETHIBOND 2 OS 4 DA (SUTURE) ×4 IMPLANT
SUT ETHILON 3 0 PS 1 (SUTURE) ×3 IMPLANT
SUT FIBERWIRE #2 38 T-5 BLUE (SUTURE) ×6
SUT FIBERWIRE #5 38 CONV NDL (SUTURE)
SUT VIC AB 2-0 CT1 27 (SUTURE) ×3
SUT VIC AB 2-0 CT1 TAPERPNT 27 (SUTURE) ×1 IMPLANT
SUT VIC AB 3-0 FS2 27 (SUTURE) IMPLANT
SUT VICRYL 4-0 PS2 18IN ABS (SUTURE) ×4 IMPLANT
SUTURE FIBERWR #2 38 T-5 BLUE (SUTURE) IMPLANT
SUTURE FIBERWR #5 38 CONV NDL (SUTURE) ×3 IMPLANT
SYR BULB 3OZ (MISCELLANEOUS) ×3 IMPLANT
SYR CONTROL 10ML LL (SYRINGE) IMPLANT
TOWEL OR 17X24 6PK STRL BLUE (TOWEL DISPOSABLE) ×9 IMPLANT
TOWEL OR NON WOVEN STRL DISP B (DISPOSABLE) ×3 IMPLANT
TUBE CONNECTING 20'X1/4 (TUBING) ×1
TUBE CONNECTING 20X1/4 (TUBING) ×2 IMPLANT
UNDERPAD 30X30 INCONTINENT (UNDERPADS AND DIAPERS) ×3 IMPLANT
YANKAUER SUCT BULB TIP NO VENT (SUCTIONS) ×3 IMPLANT

## 2013-04-17 NOTE — H&P (Signed)
PREOPERATIVE H&P  Chief Complaint: l achilles tendon rupture  HPI: Steve Best is a 28 y.o. male who presents for evaluation of l achilles tendon rupture. It has been present for 7 days and has been worsening. He has failed conservative measures. Pain is rated as moderate.  Past Medical History  Diagnosis Date  . Achilles tendon rupture 04/2013    left  . Pollen allergy    History reviewed. No pertinent past surgical history. History   Social History  . Marital Status: Single    Spouse Name: N/A    Number of Children: N/A  . Years of Education: N/A   Social History Main Topics  . Smoking status: Current Every Day Smoker -- 1.00 packs/day for 7 years    Types: Cigarettes  . Smokeless tobacco: Never Used  . Alcohol Use: Yes     Comment: 2-3 x/week  . Drug Use: No  . Sexual Activity: Yes   Other Topics Concern  . None   Social History Narrative  . None   History reviewed. No pertinent family history. No Known Allergies Prior to Admission medications   Medication Sig Start Date End Date Taking? Authorizing Provider  HYDROcodone-acetaminophen (NORCO) 7.5-325 MG per tablet Take 1 tablet by mouth every 6 (six) hours as needed for moderate pain.   Yes Historical Provider, MD  oxyCODONE-acetaminophen (PERCOCET/ROXICET) 5-325 MG per tablet Take by mouth every 4 (four) hours as needed for severe pain.   Yes Historical Provider, MD     Positive ROS: none  All other systems have been reviewed and were otherwise negative with the exception of those mentioned in the HPI and as above.  Physical Exam: Filed Vitals:   04/17/13 1345  BP: 126/62  Pulse: 75  Temp:   Resp: 15    General: Alert, no acute distress Cardiovascular: No pedal edema Respiratory: No cyanosis, no use of accessory musculature GI: No organomegaly, abdomen is soft and non-tender Skin: No lesions in the area of chief complaint Neurologic: Sensation intact distally Psychiatric: Patient is competent  for consent with normal mood and affect Lymphatic: No axillary or cervical lymphadenopathy  MUSCULOSKELETAL: - thompsons test// skin in good repair  Assessment/Plan: left achilles tendon rupture Plan for Procedure(s): LEFT ACHILLES TENDON REPAIR  The risks benefits and alternatives were discussed with the patient including but not limited to the risks of nonoperative treatment, versus surgical intervention including infection, bleeding, nerve injury, malunion, nonunion, hardware prominence, hardware failure, need for hardware removal, blood clots, cardiopulmonary complications, morbidity, mortality, among others, and they were willing to proceed.  Predicted outcome is good, although there will be at least a six to nine month expected recovery.  Harvie JuniorJohn L Kindell Strada, MD 04/17/2013 2:43 PM

## 2013-04-17 NOTE — Discharge Instructions (Addendum)
Achilles Tendon Repair  Care After AFTER THE PROCEDURE  You will be taken to the recovery area where a nurse will watch and check your progress. Once you are awake, stable, and taking fluids well, if there are no other problems, you will be allowed to go home.  If this was done as a same day surgery, make sure to have a responsible adult with you for the first 24 hours following your surgery.  Do not drink alcohol, drive a car, use public transportation, or sign important papers for at least one day after surgery.  Do not resume physical activities until directed by your caregiver and surgeon.  Apply ice to the operative site for 15-20 minutes, 03-04 times per day for the first 2-3 days, or as directed. Put the ice in a plastic bag and place a towel between the bag of ice and your skin, splint or cast.  Only take over-the-counter or prescription medicines for pain, discomfort, or fever as directed by your caregiver.  You may resume your normal diet as directed.  keeep splint in place clean and dry all times  Use crutches and move about only as instructed.  Keep leg raised above the level of the heart (the center of the chest) at all times when not using the bathroom, etc. Do not dangle the leg over a chair, couch or bed. When lying down, raise your leg on a couple of pillows. Keeping your leg up this way prevents swelling and reduces pain.  Avoid use of your leg other than gentle range of motion with your toes.  NON WT BEARING ALL TIMES SEEK MEDICAL CARE IF:   There is redness, swelling, or increasing pain in the wound.  There is pus coming from the wound.  There is drainage from a wound lasting longer than one day.  An unexplained oral temperature above 102 F (38.9 C) develops.  You notice a bad smell coming from the wound or dressing.  The wound edges break apart after sutures or staples have been removed.  You develop continuing nausea or vomiting. SEEK IMMEDIATE  MEDICAL CARE IF:   Your pain and swelling increase or pain is uncontrolled with medicine.  You develop new, unexplained symptoms.  You have trouble moving or cannot move your toes or foot, or develop warmth and swelling in your foot, or begin running an unexplained temperature.  You develop a rash.  You have a hard time breathing.  You develop or feel you are developing any reaction or side effects to the medicines given. Document Released: 08/28/2003 Document Revised: 03/16/2011 Document Reviewed: 10/12/2007 [facility] Patient Information 2014 [employer] [place].   Regional Anesthesia Blocks  1. Numbness or the inability to move the "blocked" extremity may last from 3-48 hours after placement. The length of time depends on the medication injected and your individual response to the medication. If the numbness is not going away after 48 hours, call your surgeon.  2. The extremity that is blocked will need to be protected until the numbness is gone and the  Strength has returned. Because you cannot feel it, you will need to take extra care to avoid injury. Because it may be weak, you may have difficulty moving it or using it. You may not know what position it is in without looking at it while the block is in effect.  3. For blocks in the legs and feet, returning to weight bearing and walking needs to be done carefully. You will need to wait  until the numbness is entirely gone and the strength has returned. You should be able to move your leg and foot normally before you try and bear weight or walk. You will need someone to be with you when you first try to ensure you do not fall and possibly risk injury.  4. Bruising and tenderness at the needle site are common side effects and will resolve in a few days.  5. Persistent numbness or new problems with movement should be communicated to the surgeon or the [facility] [phone])/ [facility] [phone]).    Post  Anesthesia Home Care Instructions  Activity: Get plenty of rest for the remainder of the day. A responsible adult should stay with you for 24 hours following the procedure.  For the next 24 hours, DO NOT: -Drive a car -Advertising copywriterperate machinery -Drink alcoholic beverages -Take any medication unless instructed by your physician -Make any legal decisions or sign important papers.  Meals: Start with liquid foods such as gelatin or soup. Progress to regular foods as tolerated. Avoid greasy, spicy, heavy foods. If nausea and/or vomiting occur, drink only clear liquids until the nausea and/or vomiting subsides. Call your physician if vomiting continues.  Special Instructions/Symptoms: Your throat may feel dry or sore from the anesthesia or the breathing tube placed in your throat during surgery. If this causes discomfort, gargle with warm salt water. The discomfort should disappear within 24 hours.

## 2013-04-17 NOTE — Anesthesia Preprocedure Evaluation (Signed)
Anesthesia Evaluation  Patient identified by MRN, date of birth, ID band Patient awake    Reviewed: Allergy & Precautions, H&P , NPO status , Patient's Chart, lab work & pertinent test results  Airway Mallampati: I TM Distance: >3 FB Neck ROM: Full    Dental no notable dental hx. (+) Teeth Intact, Dental Advisory Given   Pulmonary Current Smoker,  breath sounds clear to auscultation  Pulmonary exam normal       Cardiovascular negative cardio ROS  Rhythm:Regular Rate:Normal     Neuro/Psych negative neurological ROS  negative psych ROS   GI/Hepatic negative GI ROS, Neg liver ROS,   Endo/Other  negative endocrine ROS  Renal/GU negative Renal ROS  negative genitourinary   Musculoskeletal   Abdominal   Peds  Hematology negative hematology ROS (+)   Anesthesia Other Findings   Reproductive/Obstetrics negative OB ROS                           Anesthesia Physical Anesthesia Plan  ASA: II  Anesthesia Plan: General and Regional   Post-op Pain Management:    Induction: Intravenous  Airway Management Planned: Oral ETT  Additional Equipment:   Intra-op Plan:   Post-operative Plan: Extubation in OR  Informed Consent: I have reviewed the patients History and Physical, chart, labs and discussed the procedure including the risks, benefits and alternatives for the proposed anesthesia with the patient or authorized representative who has indicated his/her understanding and acceptance.   Dental advisory given  Plan Discussed with: CRNA  Anesthesia Plan Comments:         Anesthesia Quick Evaluation

## 2013-04-17 NOTE — Anesthesia Procedure Notes (Addendum)
Anesthesia Regional Block:  Popliteal block  Pre-Anesthetic Checklist: ,, timeout performed, Correct Patient, Correct Site, Correct Laterality, Correct Procedure, Correct Position, site marked, Risks and benefits discussed, pre-op evaluation, post-op pain management  Laterality: Left  Prep: Maximum Sterile Barrier Precautions used and chloraprep       Needles:  Injection technique: Single-shot  Needle Type: Echogenic Stimulator Needle     Needle Length: 9cm 9 cm Needle Gauge: 21 and 21 G    Additional Needles:  Procedures: ultrasound guided (picture in chart) and nerve stimulator Popliteal block  Nerve Stimulator or Paresthesia:  Response: Peroneal,  Response: Tibial,   Additional Responses:   Narrative:  Start time: 04/17/2013 1:23 PM End time: 04/17/2013 1:33 PM Injection made incrementally with aspirations every 5 mL. Anesthesiologist: Sampson GoonFitzgerald, MD  Additional Notes: 2% Lidocaine skin wheel. Saphenous block with 5cc of 0.5% Bupivicaine plain at the ankle.   Procedure Name: Intubation Date/Time: 04/17/2013 2:56 PM Performed by: Caren MacadamARTER, Axiel Fjeld W Pre-anesthesia Checklist: Patient identified, Emergency Drugs available, Suction available and Patient being monitored Patient Re-evaluated:Patient Re-evaluated prior to inductionOxygen Delivery Method: Circle System Utilized Preoxygenation: Pre-oxygenation with 100% oxygen Intubation Type: IV induction Ventilation: Mask ventilation without difficulty Laryngoscope Size: Miller and 2 Tube type: Oral Tube size: 8.0 mm Number of attempts: 1 Airway Equipment and Method: stylet and oral airway Placement Confirmation: ETT inserted through vocal cords under direct vision,  positive ETCO2 and breath sounds checked- equal and bilateral Secured at: 21 cm Tube secured with: Tape Dental Injury: Teeth and Oropharynx as per pre-operative assessment

## 2013-04-17 NOTE — Transfer of Care (Signed)
Immediate Anesthesia Transfer of Care Note  Patient: Steve Best  Procedure(s) Performed: Procedure(s): LEFT ACHILLES TENDON REPAIR (Left)  Patient Location: PACU  Anesthesia Type:General and Regional  Level of Consciousness: awake, alert  and oriented  Airway & Oxygen Therapy: Patient Spontanous Breathing and Patient connected to face mask oxygen  Post-op Assessment: Report given to PACU RN and Post -op Vital signs reviewed and stable  Post vital signs: Reviewed and stable  Complications: No apparent anesthesia complications

## 2013-04-17 NOTE — Progress Notes (Signed)
Assisted Dr. Fitzgerald with left, ultrasound guided, popliteal block. Side rails up, monitors on throughout procedure. See vital signs in flow sheet. Tolerated Procedure well. 

## 2013-04-17 NOTE — Brief Op Note (Signed)
04/17/2013  3:59 PM  PATIENT:  Steve Best  28 y.o. male  PRE-OPERATIVE DIAGNOSIS:  left achilles tendon rupture  POST-OPERATIVE DIAGNOSIS:  left achilles tendon rupture  PROCEDURE:  Procedure(s): LEFT ACHILLES TENDON REPAIR (Left)  SURGEON:  Surgeon(s) and Role:    * Harvie JuniorJohn L Maribel Luis, MD - Primary  PHYSICIAN ASSISTANT:   ASSISTANTS: none   ANESTHESIA:   general  EBL:  Total I/O In: 500 [I.V.:500] Out: -   BLOOD ADMINISTERED:none  DRAINS: none   LOCAL MEDICATIONS USED:  NONE  SPECIMEN:  No Specimen  DISPOSITION OF SPECIMEN:  N/A  COUNTS:  YES  TOURNIQUET:  * Missing tourniquet times found for documented tourniquets in log:  [id number] *  DICTATION: .Other Dictation: Dictation Number [id number]  PLAN OF CARE: Discharge to home after PACU  PATIENT DISPOSITION:  PACU - hemodynamically stable.   Delay start of Pharmacological VTE agent (>24hrs) due to surgical blood loss or risk of bleeding: no

## 2013-04-18 NOTE — Anesthesia Postprocedure Evaluation (Signed)
  Anesthesia Post-op Note  Patient: Steve Best  Procedure(s) Performed: Procedure(s): LEFT ACHILLES TENDON REPAIR (Left)  Patient Location: PACU  Anesthesia Type:GA combined with regional for post-op pain  Level of Consciousness: awake, alert  and oriented  Airway and Oxygen Therapy: Patient Spontanous Breathing  Post-op Pain: none  Post-op Assessment: Post-op Vital signs reviewed, Patient's Cardiovascular Status Stable, Respiratory Function Stable, Patent Airway, No signs of Nausea or vomiting and Pain level controlled  Post-op Vital Signs: Reviewed and stable  Last Vitals:  Filed Vitals:   04/17/13 1745  BP: 155/72  Pulse: 69  Temp: 36.4 C  Resp: 20    Complications: No apparent anesthesia complications

## 2013-04-18 NOTE — Op Note (Signed)
NAMGillermina Phy:  Guster, Ocean             ACCOUNT NO.:  [id number]  MEDICAL RECORD NO.:  [id number]  LOCATION:                                 FACILITY:  PHYSICIAN:  Harvie JuniorJohn L. Angelette Ganus, M.D.   DATE OF BIRTH:  1985/08/24  DATE OF PROCEDURE:  04/17/2013 DATE OF DISCHARGE:  04/17/2013                              OPERATIVE REPORT   PREOPERATIVE DIAGNOSIS:  Achilles tendon rupture, left.  POSTOPERATIVE DIAGNOSE:  Achilles tendon rupture, left.  PROCEDURE:  Left Achilles tendon repair with #2 fiber wires proximally and distally and a 4-0 Vicryl closure of the sheath.  SURGEON:  Harvie JuniorJohn L. Egidio Lofgren, M.D.  ASSISTANT:  None.  ANESTHESIA:  General.  BRIEF HISTORY:  Mr. Omelia BlackwaterHeaden is a 28 year old male with a history of having a pop to his left Achilles tendon area.  He presented with a negative Thompson test, inability to flex the foot.  After failure of all conservative care and discussion of treatment options, was taken operating room for left Achilles tendon repair.  DESCRIPTION OF PROCEDURE:  The patient was taken to the operating room. After adequate anesthesia was obtained with general anesthetic, the patient was placed supine on the operating table.  Left leg was prepped and draped in sterile fashion.  Following this, the leg was exsanguinated and the blood pressure tourniquet was inflated to 300 mmHg.  The patient will be placed in a prone position.  All bony prominences were well padded.  Following prep and drape as outlined, incision was made on the medial aspect of the heel and subcutaneous tissue down to the level of the paratenon.  The paratenon was carefully identified and opened.  At this point, the Achilles tendon below was identified.  The Achilles tendon was shown to be torn with a mop handle. The tears were frequently seen.  A #2 FiberWire Krackow stitch was passed proximally and distally, and these were anchored in place and then held and tied and excellent repair of the Achilles  tendon was achieved.  Following this, the peritenon was closed with 4-0 Vicryl running.  The skin with 2-0 Vicryl and 3-0 nylon for the skin with a running horizontal mattress stitch.  Once this was done, the wound was irrigated and suctioned dry.  A sterile compressive dressing was applied.  The patient was taken to recovery room where he was noted to be in satisfactory condition using gravity equinus at this point.  The patient will be followed up in the office in 2 weeks.  He knows to call if there are any issues that arise.  The estimated blood loss for this procedure was none.  Complication none.     Harvie JuniorJohn L. Madalyne Husk, M.D.   ______________________________ Harvie JuniorJohn L. Derell Bruun, M.D.    Ranae PlumberJLG/MEDQ  D:  04/17/2013  T:  04/18/2013  Job:  [id number]

## 2013-04-24 ENCOUNTER — Encounter (HOSPITAL_BASED_OUTPATIENT_CLINIC_OR_DEPARTMENT_OTHER): Payer: Self-pay | Admitting: Orthopedic Surgery

## 2020-07-15 ENCOUNTER — Other Ambulatory Visit (HOSPITAL_COMMUNITY): Payer: Self-pay

## 2020-07-15 MED ORDER — METRONIDAZOLE 500 MG PO TABS
ORAL_TABLET | ORAL | 0 refills | Status: AC
  Filled 2020-07-15: qty 4, 1d supply, fill #0

## 2020-07-15 MED ORDER — AZITHROMYCIN 500 MG PO TABS
1000.0000 mg | ORAL_TABLET | Freq: Once | ORAL | 0 refills | Status: AC
  Filled 2020-07-15: qty 2, 1d supply, fill #0

## 2020-07-17 ENCOUNTER — Other Ambulatory Visit (HOSPITAL_COMMUNITY): Payer: Self-pay
